# Patient Record
Sex: Male | Born: 1970 | Hispanic: Yes | Marital: Single | Smoking: Current some day smoker
Health system: Southern US, Community
[De-identification: ages and names within clinical notes are randomized; demographics above are authoritative.]

---
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
## 2019-03-15 ENCOUNTER — Other Ambulatory Visit: Payer: Self-pay

## 2019-03-15 ENCOUNTER — Emergency Department: Payer: Self-pay

## 2019-03-15 ENCOUNTER — Encounter: Payer: Self-pay | Admitting: Emergency Medicine

## 2019-03-15 ENCOUNTER — Inpatient Hospital Stay
Admission: EM | Admit: 2019-03-15 | Discharge: 2019-03-18 | DRG: 494 | Disposition: A | Discharge status: Home / Self Care | Payer: Self-pay | Attending: Orthopedic Surgery | Admitting: Orthopedic Surgery

## 2019-03-15 DIAGNOSIS — S82209A Unspecified fracture of shaft of unspecified tibia, initial encounter for closed fracture: Secondary | ICD-10-CM

## 2019-03-15 DIAGNOSIS — S82401A Unspecified fracture of shaft of right fibula, initial encounter for closed fracture: Secondary | ICD-10-CM | POA: Diagnosis present

## 2019-03-15 DIAGNOSIS — W1789XA Other fall from one level to another, initial encounter: Secondary | ICD-10-CM | POA: Diagnosis present

## 2019-03-15 DIAGNOSIS — Y9355 Activity, bike riding: Secondary | ICD-10-CM

## 2019-03-15 DIAGNOSIS — M7989 Other specified soft tissue disorders: Secondary | ICD-10-CM

## 2019-03-15 DIAGNOSIS — Z419 Encounter for procedure for purposes other than remedying health state, unspecified: Secondary | ICD-10-CM

## 2019-03-15 DIAGNOSIS — S82201A Unspecified fracture of shaft of right tibia, initial encounter for closed fracture: Secondary | ICD-10-CM

## 2019-03-15 DIAGNOSIS — S82241A Displaced spiral fracture of shaft of right tibia, initial encounter for closed fracture: Principal | ICD-10-CM

## 2019-03-15 LAB — CBC
HCT: 41.1 % (ref 39.0–52.0)
Hemoglobin: 14 g/dL (ref 13.0–17.0)
MCH: 30.3 pg (ref 26.0–34.0)
MCHC: 34.1 g/dL (ref 30.0–36.0)
MCV: 89 fL (ref 80.0–100.0)
Platelets: 254 10*3/uL (ref 150–400)
RBC: 4.62 MIL/uL (ref 4.22–5.81)
RDW: 12.3 % (ref 11.5–15.5)
WBC: 15.6 10*3/uL — ABNORMAL HIGH (ref 4.0–10.5)
nRBC: 0 % (ref 0.0–0.2)

## 2019-03-15 LAB — CBC WITH DIFFERENTIAL/PLATELET
Abs Immature Granulocytes: 0.03 10*3/uL (ref 0.00–0.07)
Basophils Absolute: 0 10*3/uL (ref 0.0–0.1)
Basophils Relative: 0 %
Eosinophils Absolute: 0.3 10*3/uL (ref 0.0–0.5)
Eosinophils Relative: 4 %
HCT: 40.4 % (ref 39.0–52.0)
Hemoglobin: 13.7 g/dL (ref 13.0–17.0)
Immature Granulocytes: 0 %
Lymphocytes Relative: 43 %
Lymphs Abs: 3.9 10*3/uL (ref 0.7–4.0)
MCH: 30.6 pg (ref 26.0–34.0)
MCHC: 33.9 g/dL (ref 30.0–36.0)
MCV: 90.2 fL (ref 80.0–100.0)
Monocytes Absolute: 0.7 10*3/uL (ref 0.1–1.0)
Monocytes Relative: 8 %
Neutro Abs: 4.1 10*3/uL (ref 1.7–7.7)
Neutrophils Relative %: 45 %
Platelets: 281 10*3/uL (ref 150–400)
RBC: 4.48 MIL/uL (ref 4.22–5.81)
RDW: 12.3 % (ref 11.5–15.5)
WBC: 9.1 10*3/uL (ref 4.0–10.5)
nRBC: 0 % (ref 0.0–0.2)

## 2019-03-15 LAB — BASIC METABOLIC PANEL
Anion gap: 11 (ref 5–15)
BUN: 14 mg/dL (ref 6–20)
CO2: 19 mmol/L — ABNORMAL LOW (ref 22–32)
Calcium: 7.7 mg/dL — ABNORMAL LOW (ref 8.9–10.3)
Chloride: 110 mmol/L (ref 98–111)
Creatinine, Ser: 1.38 mg/dL — ABNORMAL HIGH (ref 0.61–1.24)
GFR calc Af Amer: >60 mL/min (ref >60)
GFR calc non Af Amer: >60 mL/min (ref >60)
Glucose, Bld: 102 mg/dL — ABNORMAL HIGH (ref 70–99)
Potassium: 3.8 mmol/L (ref 3.5–5.1)
Sodium: 140 mmol/L (ref 135–145)

## 2019-03-15 LAB — APTT: aPTT: 29 seconds (ref 24–36)

## 2019-03-15 LAB — ETHANOL: Alcohol, Ethyl (B): 74 mg/dL — ABNORMAL HIGH (ref <10)

## 2019-03-15 LAB — COMPREHENSIVE METABOLIC PANEL
ALT: 15 U/L (ref 0–44)
AST: 19 U/L (ref 15–41)
Albumin: 3.7 g/dL (ref 3.5–5.0)
Alkaline Phosphatase: 40 U/L (ref 38–126)
Anion gap: 9 (ref 5–15)
BUN: 15 mg/dL (ref 6–20)
CO2: 22 mmol/L (ref 22–32)
Calcium: 8.2 mg/dL — ABNORMAL LOW (ref 8.9–10.3)
Chloride: 109 mmol/L (ref 98–111)
Creatinine, Ser: 1.29 mg/dL — ABNORMAL HIGH (ref 0.61–1.24)
GFR calc Af Amer: >60 mL/min (ref >60)
GFR calc non Af Amer: >60 mL/min (ref >60)
Glucose, Bld: 112 mg/dL — ABNORMAL HIGH (ref 70–99)
Potassium: 3.9 mmol/L (ref 3.5–5.1)
Sodium: 140 mmol/L (ref 135–145)
Total Bilirubin: 0.4 mg/dL (ref 0.3–1.2)
Total Protein: 6 g/dL — ABNORMAL LOW (ref 6.5–8.1)

## 2019-03-15 LAB — TYPE AND SCREEN
ABO/RH(D): O POS
Antibody Screen: NEGATIVE

## 2019-03-15 LAB — PROTIME-INR
INR: 1 (ref 0.8–1.2)
INR: 1 (ref 0.8–1.2)
Prothrombin Time: 12.8 seconds (ref 11.4–15.2)
Prothrombin Time: 13 seconds (ref 11.4–15.2)

## 2019-03-15 MED ORDER — HYDROMORPHONE HCL 1 MG/ML IJ SOLN
0.5000 mg | INTRAMUSCULAR | Status: DC | PRN
  Administered 2019-03-16 (×2): 0.5 mg via INTRAVENOUS
  Filled 2019-03-15 (×2): qty 1

## 2019-03-15 MED ORDER — HYDROMORPHONE HCL 1 MG/ML PO LIQD: 1.0000 mg | Freq: Once | ORAL | Status: DC

## 2019-03-15 MED ORDER — HYDROMORPHONE HCL 1 MG/ML IJ SOLN
INTRAMUSCULAR | Status: AC
  Administered 2019-03-15: 20:00:00 1 mg
  Filled 2019-03-15: qty 1

## 2019-03-15 MED ORDER — ONDANSETRON HCL 4 MG/2ML IJ SOLN
INTRAMUSCULAR | Status: AC
  Administered 2019-03-15: 4 mg via INTRAVENOUS
  Filled 2019-03-15: qty 2

## 2019-03-15 MED ORDER — CEFAZOLIN SODIUM-DEXTROSE 2-4 GM/100ML-% IV SOLN
2.0000 g | INTRAVENOUS | Status: AC
  Administered 2019-03-16: 2 g via INTRAVENOUS
  Filled 2019-03-15: qty 100

## 2019-03-15 MED ORDER — SODIUM CHLORIDE 0.9 % IV SOLN
INTRAVENOUS | Status: DC
  Administered 2019-03-15: 22:00:00 via INTRAVENOUS

## 2019-03-15 MED ORDER — ONDANSETRON HCL 4 MG/2ML IJ SOLN
4.0000 mg | Freq: Once | INTRAMUSCULAR | Status: AC
  Administered 2019-03-15: 4 mg via INTRAVENOUS

## 2019-03-15 MED ORDER — OXYCODONE HCL 5 MG PO TABS
5.0000 mg | ORAL_TABLET | ORAL | Status: DC | PRN
  Administered 2019-03-15 – 2019-03-16 (×3): 10 mg via ORAL
  Filled 2019-03-15 (×3): qty 2

## 2019-03-15 MED ORDER — HYDROMORPHONE HCL 1 MG/ML IJ SOLN
INTRAMUSCULAR | Status: AC
  Administered 2019-03-15: 1 mg
  Filled 2019-03-15: qty 1

## 2019-03-15 NOTE — ED Notes (Signed)
Pt co increasing pain to rle, EDP aware and meds given as ordered. Xray at bedside.

## 2019-03-15 NOTE — ED Notes (Signed)
Report called to Gavin Pound RN pt admitted to 122

## 2019-03-15 NOTE — ED Notes (Signed)
Posterior splint in place per EDT and EDP, circ and sensation normal after procedure.

## 2019-03-15 NOTE — ED Notes (Signed)
Pt awaiting admission, talking to family on phone to give them updates.
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
## 2019-03-15 NOTE — ED Notes (Signed)
Dr. Ernest Pine in to eval
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
## 2019-03-16 ENCOUNTER — Inpatient Hospital Stay: Payer: Self-pay

## 2019-03-16 ENCOUNTER — Inpatient Hospital Stay: Payer: Self-pay | Admitting: Anesthesiology

## 2019-03-16 ENCOUNTER — Encounter
Admission: EM | Disposition: A | Discharge status: Home / Self Care | Payer: Self-pay | Source: Home / Self Care | Attending: Orthopedic Surgery

## 2019-03-16 HISTORY — PX: TIBIA IM NAIL INSERTION: SHX2516

## 2019-03-16 LAB — URINALYSIS, ROUTINE W REFLEX MICROSCOPIC
Bilirubin Urine: NEGATIVE
Glucose, UA: NEGATIVE mg/dL
Hgb urine dipstick: NEGATIVE
Ketones, ur: NEGATIVE mg/dL
Leukocytes,Ua: NEGATIVE
Nitrite: NEGATIVE
Protein, ur: NEGATIVE mg/dL
Specific Gravity, Urine: 1.013 (ref 1.005–1.030)
pH: 6 (ref 5.0–8.0)

## 2019-03-16 LAB — SURGICAL PCR SCREEN
MRSA, PCR: NEGATIVE
Staphylococcus aureus: NEGATIVE

## 2019-03-16 SURGERY — INSERTION, INTRAMEDULLARY ROD, TIBIA
Anesthesia: General | Laterality: Right

## 2019-03-16 MED ORDER — ONDANSETRON HCL 4 MG PO TABS: 4.0000 mg | ORAL_TABLET | Freq: Four times a day (QID) | ORAL | Status: DC | PRN

## 2019-03-16 MED ORDER — PROPOFOL 500 MG/50ML IV EMUL
INTRAVENOUS | Status: AC
  Filled 2019-03-16: qty 50

## 2019-03-16 MED ORDER — PHENYLEPHRINE HCL (PRESSORS) 10 MG/ML IV SOLN
INTRAVENOUS | Status: DC | PRN
  Administered 2019-03-16 (×3): 100 ug via INTRAVENOUS

## 2019-03-16 MED ORDER — OXYCODONE HCL 5 MG PO TABS: 5.0000 mg | ORAL_TABLET | ORAL | Status: DC | PRN

## 2019-03-16 MED ORDER — LIDOCAINE HCL (CARDIAC) PF 100 MG/5ML IV SOSY
PREFILLED_SYRINGE | INTRAVENOUS | Status: DC | PRN
  Administered 2019-03-16: 100 mg via INTRAVENOUS

## 2019-03-16 MED ORDER — LIDOCAINE HCL (PF) 2 % IJ SOLN
INTRAMUSCULAR | Status: AC
  Filled 2019-03-16: qty 10

## 2019-03-16 MED ORDER — FENTANYL CITRATE (PF) 100 MCG/2ML IJ SOLN
INTRAMUSCULAR | Status: DC | PRN
  Administered 2019-03-16 (×4): 50 ug via INTRAVENOUS

## 2019-03-16 MED ORDER — ENOXAPARIN SODIUM 40 MG/0.4ML ~~LOC~~ SOLN
40.0000 mg | SUBCUTANEOUS | Status: DC
  Administered 2019-03-17 – 2019-03-18 (×2): 40 mg via SUBCUTANEOUS
  Filled 2019-03-16 (×2): qty 0.4

## 2019-03-16 MED ORDER — MENTHOL 3 MG MT LOZG
1.0000 | LOZENGE | OROMUCOSAL | Status: DC | PRN
  Filled 2019-03-16: qty 9

## 2019-03-16 MED ORDER — MIDAZOLAM HCL 2 MG/2ML IJ SOLN
INTRAMUSCULAR | Status: DC | PRN
  Administered 2019-03-16: 2 mg via INTRAVENOUS

## 2019-03-16 MED ORDER — METOCLOPRAMIDE HCL 5 MG PO TABS
10.0000 mg | ORAL_TABLET | Freq: Three times a day (TID) | ORAL | Status: DC
  Administered 2019-03-16 – 2019-03-18 (×7): 10 mg via ORAL
  Filled 2019-03-16 (×7): qty 2

## 2019-03-16 MED ORDER — METOCLOPRAMIDE HCL 5 MG PO TABS: 5.0000 mg | ORAL_TABLET | Freq: Three times a day (TID) | ORAL | Status: DC | PRN

## 2019-03-16 MED ORDER — MIDAZOLAM HCL 2 MG/2ML IJ SOLN
INTRAMUSCULAR | Status: AC
  Filled 2019-03-16: qty 2

## 2019-03-16 MED ORDER — CEFAZOLIN SODIUM-DEXTROSE 2-4 GM/100ML-% IV SOLN
2.0000 g | Freq: Four times a day (QID) | INTRAVENOUS | Status: AC
  Administered 2019-03-16 – 2019-03-17 (×4): 2 g via INTRAVENOUS
  Filled 2019-03-16 (×5): qty 100

## 2019-03-16 MED ORDER — ONDANSETRON HCL 4 MG/2ML IJ SOLN
INTRAMUSCULAR | Status: AC
  Filled 2019-03-16: qty 2

## 2019-03-16 MED ORDER — FENTANYL CITRATE (PF) 100 MCG/2ML IJ SOLN
INTRAMUSCULAR | Status: AC
  Filled 2019-03-16: qty 2

## 2019-03-16 MED ORDER — ROCURONIUM BROMIDE 100 MG/10ML IV SOLN
INTRAVENOUS | Status: DC | PRN
  Administered 2019-03-16 (×2): 25 mg via INTRAVENOUS

## 2019-03-16 MED ORDER — HYDROMORPHONE HCL 1 MG/ML IJ SOLN: 0.5000 mg | INTRAMUSCULAR | Status: DC | PRN

## 2019-03-16 MED ORDER — OXYCODONE HCL 5 MG PO TABS
10.0000 mg | ORAL_TABLET | ORAL | Status: DC | PRN
  Administered 2019-03-16 – 2019-03-18 (×9): 10 mg via ORAL
  Filled 2019-03-16 (×9): qty 2

## 2019-03-16 MED ORDER — METOCLOPRAMIDE HCL 5 MG/ML IJ SOLN: 5.0000 mg | Freq: Three times a day (TID) | INTRAMUSCULAR | Status: DC | PRN

## 2019-03-16 MED ORDER — SEVOFLURANE IN SOLN
RESPIRATORY_TRACT | Status: AC
  Filled 2019-03-16: qty 250

## 2019-03-16 MED ORDER — LACTATED RINGERS IV SOLN
INTRAVENOUS | Status: DC | PRN
  Administered 2019-03-16 (×2): via INTRAVENOUS

## 2019-03-16 MED ORDER — FENTANYL CITRATE (PF) 100 MCG/2ML IJ SOLN
INTRAMUSCULAR | Status: AC
  Administered 2019-03-16: 25 ug via INTRAVENOUS
  Filled 2019-03-16: qty 2

## 2019-03-16 MED ORDER — SENNOSIDES-DOCUSATE SODIUM 8.6-50 MG PO TABS
1.0000 | ORAL_TABLET | Freq: Two times a day (BID) | ORAL | Status: DC
  Administered 2019-03-16 – 2019-03-18 (×4): 1 via ORAL
  Filled 2019-03-16 (×4): qty 1

## 2019-03-16 MED ORDER — SUGAMMADEX SODIUM 200 MG/2ML IV SOLN
INTRAVENOUS | Status: DC | PRN
  Administered 2019-03-16: 200 mg via INTRAVENOUS

## 2019-03-16 MED ORDER — MAGNESIUM HYDROXIDE 400 MG/5ML PO SUSP
30.0000 mL | Freq: Every day | ORAL | Status: DC | PRN
  Administered 2019-03-18: 07:00:00 30 mL via ORAL
  Filled 2019-03-16 (×2): qty 30

## 2019-03-16 MED ORDER — ONDANSETRON HCL 4 MG/2ML IJ SOLN: 4.0000 mg | Freq: Four times a day (QID) | INTRAMUSCULAR | Status: DC | PRN

## 2019-03-16 MED ORDER — CEFAZOLIN SODIUM-DEXTROSE 2-4 GM/100ML-% IV SOLN
INTRAVENOUS | Status: AC
  Filled 2019-03-16: qty 100

## 2019-03-16 MED ORDER — TRAMADOL HCL 50 MG PO TABS
50.0000 mg | ORAL_TABLET | ORAL | Status: DC | PRN
  Administered 2019-03-17 – 2019-03-18 (×3): 100 mg via ORAL
  Filled 2019-03-16 (×3): qty 2

## 2019-03-16 MED ORDER — PANTOPRAZOLE SODIUM 40 MG PO TBEC
40.0000 mg | DELAYED_RELEASE_TABLET | Freq: Two times a day (BID) | ORAL | Status: DC
  Administered 2019-03-16 – 2019-03-18 (×4): 40 mg via ORAL
  Filled 2019-03-16 (×5): qty 1

## 2019-03-16 MED ORDER — OXYCODONE HCL 5 MG PO TABS: 5.0000 mg | ORAL_TABLET | Freq: Once | ORAL | Status: DC | PRN

## 2019-03-16 MED ORDER — BUPIVACAINE HCL (PF) 0.5 % IJ SOLN
INTRAMUSCULAR | Status: AC
  Filled 2019-03-16: qty 10

## 2019-03-16 MED ORDER — ACETAMINOPHEN 10 MG/ML IV SOLN
1000.0000 mg | Freq: Four times a day (QID) | INTRAVENOUS | Status: AC
  Administered 2019-03-16 – 2019-03-17 (×4): 1000 mg via INTRAVENOUS
  Filled 2019-03-16 (×4): qty 100

## 2019-03-16 MED ORDER — SUCCINYLCHOLINE CHLORIDE 20 MG/ML IJ SOLN
INTRAMUSCULAR | Status: DC | PRN
  Administered 2019-03-16: 100 mg via INTRAVENOUS

## 2019-03-16 MED ORDER — FLEET ENEMA 7-19 GM/118ML RE ENEM: 1.0000 | ENEMA | Freq: Once | RECTAL | Status: DC | PRN

## 2019-03-16 MED ORDER — ACETAMINOPHEN 10 MG/ML IV SOLN
INTRAVENOUS | Status: DC | PRN
  Administered 2019-03-16: 1000 mg via INTRAVENOUS

## 2019-03-16 MED ORDER — FENTANYL CITRATE (PF) 100 MCG/2ML IJ SOLN
25.0000 ug | INTRAMUSCULAR | Status: AC | PRN
  Administered 2019-03-16 (×6): 25 ug via INTRAVENOUS

## 2019-03-16 MED ORDER — ONDANSETRON HCL 4 MG/2ML IJ SOLN
INTRAMUSCULAR | Status: DC | PRN
  Administered 2019-03-16: 4 mg via INTRAVENOUS

## 2019-03-16 MED ORDER — PHENOL 1.4 % MT LIQD
1.0000 | OROMUCOSAL | Status: DC | PRN
  Filled 2019-03-16: qty 177

## 2019-03-16 MED ORDER — OXYCODONE HCL 5 MG/5ML PO SOLN: 5.0000 mg | Freq: Once | ORAL | Status: DC | PRN

## 2019-03-16 MED ORDER — DEXAMETHASONE SODIUM PHOSPHATE 10 MG/ML IJ SOLN
INTRAMUSCULAR | Status: AC
  Filled 2019-03-16: qty 1

## 2019-03-16 MED ORDER — BISACODYL 10 MG RE SUPP: 10.0000 mg | Freq: Every day | RECTAL | Status: DC | PRN

## 2019-03-16 MED ORDER — PROPOFOL 10 MG/ML IV BOLUS
INTRAVENOUS | Status: DC | PRN
  Administered 2019-03-16: 200 mg via INTRAVENOUS

## 2019-03-16 MED ORDER — ACETAMINOPHEN 10 MG/ML IV SOLN
INTRAVENOUS | Status: AC
  Filled 2019-03-16: qty 100

## 2019-03-16 MED ORDER — SUGAMMADEX SODIUM 200 MG/2ML IV SOLN
INTRAVENOUS | Status: AC
  Filled 2019-03-16: qty 2

## 2019-03-16 MED ORDER — SODIUM CHLORIDE 0.9 % IV SOLN
INTRAVENOUS | Status: DC
  Administered 2019-03-16 – 2019-03-17 (×2): via INTRAVENOUS

## 2019-03-16 MED ORDER — CELECOXIB 200 MG PO CAPS
200.0000 mg | ORAL_CAPSULE | Freq: Two times a day (BID) | ORAL | Status: DC
  Administered 2019-03-17 – 2019-03-18 (×4): 200 mg via ORAL
  Filled 2019-03-16 (×5): qty 1

## 2019-03-16 MED ORDER — ACETAMINOPHEN 325 MG PO TABS
325.0000 mg | ORAL_TABLET | Freq: Four times a day (QID) | ORAL | Status: DC | PRN
  Administered 2019-03-18 (×2): 650 mg via ORAL
  Filled 2019-03-16 (×2): qty 2

## 2019-03-16 MED ORDER — ROCURONIUM BROMIDE 50 MG/5ML IV SOLN
INTRAVENOUS | Status: AC
  Filled 2019-03-16: qty 1

## 2019-03-16 SURGICAL SUPPLY — 45 items
BIT DRILL CAL 3.2 LONG (BIT) ×2 IMPLANT
BIT DRILL CAL 3.2MM LONG (BIT) ×1
BIT DRILL SHORT 3.2MM (DRILL) ×1 IMPLANT
CANISTER SUCT 1200ML W/VALVE (MISCELLANEOUS) ×3 IMPLANT
COVER WAND RF STERILE (DRAPES) ×3 IMPLANT
CUFF TOURN SGL QUICK 24 (TOURNIQUET CUFF) ×2
CUFF TRNQT CYL 24X4X16.5-23 (TOURNIQUET CUFF) ×1 IMPLANT
DRAPE C-ARM XRAY 36X54 (DRAPES) ×3 IMPLANT
DRAPE C-ARMOR (DRAPES) ×3 IMPLANT
DRAPE U-SHAPE 47X51 STRL (DRAPES) ×3 IMPLANT
DRILL SHORT 3.2MM (DRILL) ×3
DRSG DERMACEA 8X12 NADH (GAUZE/BANDAGES/DRESSINGS) ×3 IMPLANT
DRSG OPSITE POSTOP 3X4 (GAUZE/BANDAGES/DRESSINGS) ×9 IMPLANT
DRSG OPSITE POSTOP 4X8 (GAUZE/BANDAGES/DRESSINGS) ×3 IMPLANT
DURAPREP 26ML APPLICATOR (WOUND CARE) ×6 IMPLANT
ELECT REM PT RETURN 9FT ADLT (ELECTROSURGICAL) ×3
ELECTRODE REM PT RTRN 9FT ADLT (ELECTROSURGICAL) ×1 IMPLANT
GLOVE BIOGEL M STRL SZ7.5 (GLOVE) ×3 IMPLANT
GLOVE INDICATOR 8.0 STRL GRN (GLOVE) ×3 IMPLANT
GOWN STRL REUS W/ TWL LRG LVL3 (GOWN DISPOSABLE) ×2 IMPLANT
GOWN STRL REUS W/TWL LRG LVL3 (GOWN DISPOSABLE) ×4
GUIDEWIRE 3.2X400 (WIRE) ×3 IMPLANT
HOLDER FOLEY CATH W/STRAP (MISCELLANEOUS) ×3 IMPLANT
KIT TURNOVER KIT A (KITS) ×3 IMPLANT
NAIL TIBIAL EX 9X330MM (Nail) ×3 IMPLANT
NEEDLE FILTER BLUNT 18X 1/2SAF (NEEDLE) ×2
NEEDLE FILTER BLUNT 18X1 1/2 (NEEDLE) ×1 IMPLANT
NS IRRIG 1000ML POUR BTL (IV SOLUTION) ×3 IMPLANT
PACK TOTAL KNEE (MISCELLANEOUS) ×3 IMPLANT
REAMER ROD DEEP FLUTE 2.5X950 (INSTRUMENTS) ×3 IMPLANT
SCREW LOCK 4X36 TI (Screw) ×3 IMPLANT
SCREW LOCK T25 FT 32X4X3.3X (Screw) ×2 IMPLANT
SCREW LOCKING 4.0 34MM (Screw) ×3 IMPLANT
SCREW LOCKING 4.0 40MM (Screw) ×3 IMPLANT
SCREW LOCKING 4X32 (Screw) ×4 IMPLANT
SCREW LOCKING 4X38 (Screw) ×3 IMPLANT
SOL PREP PVP 2OZ (MISCELLANEOUS) ×3
SOLUTION PREP PVP 2OZ (MISCELLANEOUS) ×1 IMPLANT
STAPLER SKIN PROX 35W (STAPLE) ×3 IMPLANT
SUT VIC AB 0 CT1 36 (SUTURE) ×3 IMPLANT
SUT VIC AB 1 CT1 36 (SUTURE) ×3 IMPLANT
SUT VIC AB 2-0 CT1 27 (SUTURE) ×2
SUT VIC AB 2-0 CT1 TAPERPNT 27 (SUTURE) ×1 IMPLANT
SYR 10ML LL (SYRINGE) IMPLANT
TRAY FOLEY SLVR 16FR LF STAT (SET/KITS/TRAYS/PACK) ×6 IMPLANT

## 2019-03-16 NOTE — Transfer of Care (Signed)
Immediate Anesthesia Transfer of Care Note  Patient: Grant Zamora  Procedure(s) Performed: Procedure(s): INTRAMEDULLARY (IM) NAIL TIBIAL (Right)  Patient Location: PACU  Anesthesia Type:General  Level of Consciousness: sedated  Airway & Oxygen Therapy: Patient Spontanous Breathing and Patient connected to face mask oxygen  Post-op Assessment: Report given to RN and Post -op Vital signs reviewed and stable  Post vital signs: Reviewed and stable  Last Vitals:  Vitals:   03/16/19 1519 03/16/19 1520  BP:  131/89  Pulse:    Resp:  20  Temp: (P) 36.6 C   SpO2:  99%    Complications: No apparent anesthesia complications

## 2019-03-16 NOTE — Progress Notes (Signed)
Pt off floor to OR

## 2019-03-16 NOTE — Anesthesia Procedure Notes (Signed)
Procedure Name: Intubation Performed by: Stormy Fabian, CRNA Pre-anesthesia Checklist: Patient identified, Patient being monitored, Timeout performed, Emergency Drugs available and Suction available Patient Re-evaluated:Patient Re-evaluated prior to induction Oxygen Delivery Method: Circle system utilized Preoxygenation: Pre-oxygenation with 100% oxygen Induction Type: IV induction Ventilation: Mask ventilation without difficulty Laryngoscope Size: McGraph and 4 Grade View: Grade I Tube type: Oral Tube size: 7.5 mm Number of attempts: 1 Airway Equipment and Method: Stylet and Video-laryngoscopy Placement Confirmation: ETT inserted through vocal cords under direct vision,  positive ETCO2 and breath sounds checked- equal and bilateral Secured at: 21 cm Tube secured with: Tape Dental Injury: Teeth and Oropharynx as per pre-operative assessment

## 2019-03-16 NOTE — Anesthesia Preprocedure Evaluation (Addendum)
Anesthesia Evaluation  Patient identified by MRN, date of birth, ID band Patient awake    Reviewed: Allergy & Precautions, H&P , NPO status , Patient's Chart, lab work & pertinent test results, reviewed documented beta blocker date and time   History of Anesthesia Complications Negative for: history of anesthetic complications  Airway Mallampati: III  TM Distance: >3 FB Neck ROM: full    Dental  (+) Chipped   Pulmonary neg pulmonary ROS, neg shortness of breath, Current Smoker,    Pulmonary exam normal        Cardiovascular Exercise Tolerance: Good (-) angina(-) Past MI and (-) DOE negative cardio ROS Normal cardiovascular exam Rhythm:regular Rate:Normal     Neuro/Psych negative neurological ROS  negative psych ROS   GI/Hepatic negative GI ROS, Neg liver ROS, neg GERD  ,  Endo/Other  negative endocrine ROS  Renal/GU negative Renal ROS  negative genitourinary   Musculoskeletal   Abdominal   Peds  Hematology negative hematology ROS (+)   Anesthesia Other Findings History reviewed. No pertinent past medical history.  Past Surgical History: 1990: CRANIOTOMY     Comment:  ?  Evacuation of subdural hematoma  BMI    Body Mass Index:  25.10 kg/m      Reproductive/Obstetrics negative OB ROS                            Anesthesia Physical Anesthesia Plan  ASA: II and emergent  Anesthesia Plan: General ETT   Post-op Pain Management:    Induction: Intravenous  PONV Risk Score and Plan: 2 and Dexamethasone, Ondansetron, Midazolam and Treatment may vary due to age or medical condition  Airway Management Planned: Oral ETT  Additional Equipment:   Intra-op Plan:   Post-operative Plan: Extubation in OR  Informed Consent: I have reviewed the patients History and Physical, chart, labs and discussed the procedure including the risks, benefits and alternatives for the proposed anesthesia  with the patient or authorized representative who has indicated his/her understanding and acceptance.     Dental Advisory Given  Plan Discussed with: Anesthesiologist, CRNA and Surgeon  Anesthesia Plan Comments: (Patient consented for risks of anesthesia including but not limited to:  - adverse reactions to medications - damage to teeth, lips or other oral mucosa - sore throat or hoarseness - Damage to heart, brain, lungs or loss of life  Patient voiced understanding.)      Anesthesia Quick Evaluation

## 2019-03-16 NOTE — Anesthesia Post-op Follow-up Note (Signed)
Anesthesia QCDR form completed.        
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
## 2019-03-17 ENCOUNTER — Encounter: Payer: Self-pay | Admitting: Orthopedic Surgery

## 2019-03-17 LAB — HIV ANTIBODY (ROUTINE TESTING W REFLEX): HIV Screen 4th Generation wRfx: NONREACTIVE

## 2019-03-17 NOTE — TOC Initial Note (Signed)
Transition of Care (TOC) - Initial/Assessment Note    Patient Details  Name: Grant Zamora MRN: [id number] Date of Birth: 08/06/1971  Transition of Care (TOC) CM/SW Contact:    Deliliah J Gregory, RN Phone Number: 03/17/2019, 2:11 PM  Clinical Narrative:                 Met with the patient to discuss DC needs and plan He lives at home with minor children and spouse, he drives and gets around independently, since he is partial weight bearing will need a RW, notified Brad with [employer] Needs assistance with Lovenox at DC, gave [facility] card, Med Mgt can't help with Lovenox He has no other needs at this time  Expected Discharge Plan: Home/Self Care Barriers to Discharge: Continued Medical Work up   Patient Goals and CMS Choice Patient states their goals for this hospitalization and ongoing recovery are:: go home      Expected Discharge Plan and Services Expected Discharge Plan: Home/Self Care   Discharge Planning Services: CM Consult, Medication Assistance(Gave [facility] card, Medication management does not help with lovenox)   Living arrangements for the past 2 months: Single Family Home                 DME Arranged: Walker rolling DME Agency: [employer] Date DME Agency Contacted: 03/17/19 Time DME Agency Contacted: [id number] Representative spoke with at DME Agency: Brad HH Arranged: (declines)          Prior Living Arrangements/Services Living arrangements for the past 2 months: Single Family Home Lives with:: Relatives, Minor Children, Spouse Patient language and need for interpreter reviewed:: No Do you feel safe going back to the place where you live?: Yes      Need for Family Participation in Patient Care: No (Comment) Care giver support system in place?: Yes (comment)   Criminal Activity/Legal Involvement Pertinent to Current Situation/Hospitalization: No - Comment as needed  Activities of Daily Living Home Assistive Devices/Equipment: None ADL Screening (condition  at time of admission) Patient's cognitive ability adequate to safely complete daily activities?: Yes Is the patient deaf or have difficulty hearing?: No Does the patient have difficulty seeing, even when wearing glasses/contacts?: No Does the patient have difficulty concentrating, remembering, or making decisions?: No Patient able to express need for assistance with ADLs?: Yes Does the patient have difficulty dressing or bathing?: No Independently performs ADLs?: Yes (appropriate for developmental age) Does the patient have difficulty walking or climbing stairs?: No Weakness of Legs: None Weakness of Arms/Hands: None  Permission Sought/Granted                  Emotional Assessment Appearance:: Appears stated age Attitude/Demeanor/Rapport: Engaged Affect (typically observed): Accepting Orientation: : Oriented to Self, Oriented to Place, Oriented to  Time, Oriented to Situation Alcohol / Substance Use: Tobacco Use Psych Involvement: No (comment)  Admission diagnosis:  Closed fracture of right tibia and fibula, initial encounter [S82.201A, S82.401A] Patient Active Problem List   Diagnosis Date Noted  . Displaced spiral fracture of shaft of right tibia, initial encounter for closed fracture 03/15/2019   PCP:  Patient, No Pcp Per Pharmacy:   [id number] - [place], [place] - [address] [place] [address] Phone: [phone] Fax: [phone]     Social Determinants of Health (SDOH) Interventions    Readmission Risk Interventions No flowsheet data found.  

## 2019-03-17 NOTE — Progress Notes (Signed)
ORTHOPAEDICS PROGRESS NOTE  PATIENT NAME: Grant Zamora DOB: July 06, 1971  MRN: [id number]  POD # 1: IM nailing of right tibial shaft fracture   Subjective: The patient rested well last night.  Pain has been under good control.  He denies any nausea or vomiting.  Objective: Vital signs in last 24 hours: Temp:  [97.6 F (36.4 C)-98.8 F (37.1 C)] 98 F (36.7 C) (04/30 0426) Pulse Rate:  [72-116] 72 (04/30 0426) Resp:  [11-24] 18 (04/30 0426) BP: (101-131)/(71-89) 104/72 (04/30 0426) SpO2:  [91 %-100 %] 97 % (04/30 0426) Weight:  [77.1 kg] 77.1 kg (04/29 1030)  Intake/Output from previous day: 04/29 0701 - 04/30 0700 In: 1584.5 [P.O.:240; I.V.:1274.6; IV Piggyback:69.8] Out: 4350 [Urine:4300; Blood:50]  Recent Labs    03/15/19 2014 03/15/19 2227  WBC 9.1 15.6*  HGB 13.7 14.0  HCT 40.4 41.1  PLT 281 254  K 3.8 3.9  CL 110 109  CO2 19* 22  BUN 14 15  CREATININE 1.38* 1.29*  GLUCOSE 102* 112*  CALCIUM 7.7* 8.2*  INR 1.0 1.0    EXAM General: Well-developed well-nourished male seen in no apparent discomfort. Lungs: clear to auscultation Cardiac: normal rate and regular rhythm Right lower extremity: Dressing is dry and intact.  Gentle range of motion of the knee and ankle is well-tolerated.  Pedal pulses are palpable.  Good capillary refill. Neurologic: Awake, alert, and oriented.  Sensory and motor function are intact.  Assessment: IM nailing of a right tibial shaft fracture  Plan: Begin physical therapy and Occupational Therapy.  The patient is to be only partial weightbearing to the right lower extremity. Plan is to go Home after hospital stay. DVT Prophylaxis - Lovenox, Foot Pumps and TED hose  Rei Contee P. Angie Fava M.D.
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
## 2019-03-18 ENCOUNTER — Inpatient Hospital Stay: Payer: Self-pay

## 2019-03-18 MED ORDER — ENOXAPARIN SODIUM 40 MG/0.4ML ~~LOC~~ SOLN: 40.0000 mg | SUBCUTANEOUS | 0 refills | Status: DC

## 2019-03-18 MED ORDER — OXYCODONE HCL 5 MG PO TABS: 5.0000 mg | ORAL_TABLET | ORAL | 0 refills | Status: DC | PRN
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
## 2019-03-18 NOTE — Progress Notes (Signed)
PT Cancellation Note  Patient Details Name: BASCOM STAMLER MRN: [id number] DOB: [birth year]   Cancelled Treatment:    Reason Eval/Treat Not Completed: Medical issues which prohibited therapy;Patient not medically ready Patient gone to ultrasound d/t LE swelling. Will check back as able  Staci Acosta PT, DPT Staci Acosta 03/18/2019, 9:15 AM
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
## 2019-03-18 NOTE — Progress Notes (Signed)
  Subjective: 2 Days Post-Op Procedure(s) (LRB): INTRAMEDULLARY (IM) NAIL TIBIAL (Right) Patient reports pain as mild.   Patient is well, and has had no acute complaints or problems Plan is to go Home after hospital stay. Negative for chest pain and shortness of breath Fever: no Gastrointestinal:Negative for nausea and vomiting  Objective: Vital signs in last 24 hours: Temp:  [97.7 F (36.5 C)-98.7 F (37.1 C)] 98.3 F (36.8 C) (05/01 0751) Pulse Rate:  [70-86] 71 (05/01 0751) Resp:  [17-19] 17 (05/01 0751) BP: (101-112)/(61-82) 101/74 (05/01 0751) SpO2:  [96 %-100 %] 98 % (05/01 0751)  Intake/Output from previous day:  Intake/Output Summary (Last 24 hours) at 03/18/2019 0821 Last data filed at 03/18/2019 0407 Gross per 24 hour  Intake 600 ml  Output 1798 ml  Net -1198 ml    Intake/Output this shift: No intake/output data recorded.  Labs: Recent Labs    03/15/19 2014 03/15/19 2227  HGB 13.7 14.0   Recent Labs    03/15/19 2014 03/15/19 2227  WBC 9.1 15.6*  RBC 4.48 4.62  HCT 40.4 41.1  PLT 281 254   Recent Labs    03/15/19 2014 03/15/19 2227  NA 140 140  K 3.8 3.9  CL 110 109  CO2 19* 22  BUN 14 15  CREATININE 1.38* 1.29*  GLUCOSE 102* 112*  CALCIUM 7.7* 8.2*   Recent Labs    03/15/19 2014 03/15/19 2227  INR 1.0 1.0     EXAM General - Patient is Alert, Appropriate and Oriented Extremity - Neurovascular intact Sensation intact distally Intact pulses distally Dorsiflexion/Plantar flexion intact Dressing/Incision - Bulky dressing removed, honeycomb dressing at the ankle changed this AM.  New ACE wrap applied. Motor Function - intact, moving foot and toes well on exam.  Positive Homan's to the right leg this morning.  History reviewed. No pertinent past medical history.  Assessment/Plan: 2 Days Post-Op Procedure(s) (LRB): INTRAMEDULLARY (IM) NAIL TIBIAL (Right) Active Problems:   Displaced spiral fracture of shaft of right tibia, initial  encounter for closed fracture  Estimated body mass index is 25.1 kg/m as calculated from the following:   Height as of this encounter: 5\' 9"  (1.753 m).   Weight as of this encounter: 77.1 kg. Advance diet Up with therapy   Patient performed well with PT yesterday. No signs of infection to the right leg. Questionable Homan's test this morning, will obtain US prior to discharge. If [place] negative will plan for discharge home today.  DVT Prophylaxis - Lovenox and Foot Pumps TTWB to the right leg.  Valeria Batman, PA-C [facility] 03/18/2019, 8:21 AM

## 2019-03-18 NOTE — TOC Transition Note (Signed)
Transition of Care [facility]) - CM/SW Discharge Note   Patient Details  Name: Grant Zamora MRN: [id number] Date of Birth: November 07, 1971  [facility] CM/SW Contact:  Virgel Manifold, RN Phone Number: 03/18/2019, 1:24 PM   Clinical Narrative:  Patient to discharge with outpatient PT/OT. Patient is uninsured but apparently has been working with [facility] ortho office on obtaining outpatient therapy schedule. I have talked with ortho PA about this and the patient was given a number to schedule his appointments with. Charity rolling walker and crutches were given to patient.     Final next level of care: Home/Self Care Barriers to Discharge: Continued Medical Work up   Patient Goals and CMS Choice Patient states their goals for this hospitalization and ongoing recovery are:: go home      Discharge Placement                       Discharge Plan and Services   Discharge Planning Services: CM Consult, Medication Assistance(Gave Good RX card, Medication management does not help with lovenox)            DME Arranged: Walker rolling DME Agency: [employer] Date DME Agency Contacted: 03/17/19 Time DME Agency Contacted: [id number] Representative spoke with at DME Agency: [facility] HH Arranged: (declines)          Social Determinants of Health (SDOH) Interventions     Readmission Risk Interventions No flowsheet data found.

## 2019-03-18 NOTE — Progress Notes (Signed)
OT Cancellation Note  Patient Details Name: Grant Zamora MRN: [id number] DOB: Sep 07, 1971   Cancelled Treatment:    Reason Eval/Treat Not Completed: (Ultrasound to r/o DVT secondary to right LE edema, pain, and swelling. WIll continue to monitor, and intervene at a later/date time. )  Olegario Messier, MS, OTR/L 03/18/2019, 11:08 AM

## 2019-03-18 NOTE — Discharge Instructions (Signed)
Take aspirin 325mg  daily for DVT Preventon  Intramedullary Nailing of Tibial Diaphyseal Fracture, Care After This sheet gives you information about how to care for yourself after your procedure. Your health care provider may also give you more specific instructions. If you have problems or questions, contact your health care provider. What can I expect after the procedure? After the procedure, it is common to have:  Pain and swelling in your lower leg. Follow these instructions at home: Medicines  Take over-the-counter and prescription medicines only as told by your health care provider.  If you were prescribed an antibiotic medicine, take it as told by your health care provider. Do not stop taking the antibiotic even if you start to feel better.  Do not drive or use heavy machinery while taking prescription medicine or until your health care provider approves. If you have a splint:  Wear the splint as told by your health care provider. Remove it only as told by your health care provider.  Loosen the splint if your toes tingle, become numb, or turn cold and blue.  Keep the splint clean.  If the splint is not waterproof: ? Do not let it get wet. ? Cover it with a watertight covering when you take a shower. Bathing  Do not take baths, swim, or use a hot tub until your health care provider approves. You may only be allowed to take sponge baths for bathing for the first few days.  Ask your health care provider if you can take showers.  Keep your bandage (dressing) dry until your health care provider says it can be removed. Incision care   Follow instructions from your health care provider about how to take care of your incision. Make sure you: ? Wash your hands with soap and water before you change your bandage (dressing). If soap and water are not available, use hand sanitizer. ? Change your dressing as told by your health care provider. ? Leave stitches (sutures), skin glue, or  adhesive strips in place. These skin closures may need to stay in place for 2 weeks or longer. If adhesive strip edges start to loosen and curl up, you may trim the loose edges. Do not remove adhesive strips completely unless your health care provider tells you to do that.  Check your incision area every day for signs of infection. Check for: ? More redness, swelling, or pain. ? More fluid or blood. ? Warmth. ? Pus or a bad smell. Managing pain, stiffness, and swelling    Raise (elevate) the injured area above the level of your heart while you are sitting or lying down.  Move your toes often to avoid stiffness and to lessen swelling.  If directed, put ice on the injured area. ? Put ice in a plastic bag. ? Place a towel between your skin and the bag. ? Leave the ice on for 20 minutes, 2-3 times a day. Activity  Do not use the injured limb to support your body weight. You may be able to put some weight on your leg (partial weight-bearing). Use crutches or a walker. Follow directions as told by your healthcare provider.  Avoid sitting or lying for a long time without moving. You will be encouraged to walk with assistance as soon as you can. This will help prevent blood clots.  If physical therapy was prescribed, do exercises as directed. It is important to do physical therapy to regain muscle strength and range of motion in your leg.  Rest and  avoid activities that require a lot of effort or put you at risk for a fall or another leg injury. Ask your health care provider what activities are safe for you and when you can begin leg motion after surgery. General instructions  To prevent or treat constipation while you are taking prescription pain medicine, your health care provider may recommend that you: ? Drink enough fluid to keep your urine clear or pale yellow. ? Take over-the-counter or prescription medicines. ? Eat foods that are high in fiber, such as fresh fruits and vegetables,  whole grains, and beans. ? Limit foods that are high in fat and processed sugars, such as fried and sweet foods.  Do not use any products that contain nicotine or tobacco, such as cigarettes and e-cigarettes. These can delay bone healing. If you need help quitting, ask your health care provider.  Take steps to prevent falls at home, such as removing throw rugs and tripping hazards.  Keep all follow-up visits as told by your health care provider. This is important. Contact a health care provider if:  You have more redness, swelling, or pain around your incision.  You have more fluid or blood coming from your incision.  Your incision feels warm to the touch.  You have pus or a bad smell coming from your incision.  You have a fever. Get help right away if:  Your incision breaks open.  You have red, painful, or swollen areas in one or both legs.  You have severe pain that does not get better with medicine. Summary  After your procedure, it is common to have pain and swelling in your lower leg.  Check your incision area every day for signs of infection, such as more redness or swelling.  Do not use the injured limb to support your body weight. You may be able to put some weight on your leg (partial weight-bearing). Use crutches or a walker. Follow directions as told by your healthcare provider.  Ask your health care provider what activities are safe for you while you recover. It is important to do physical therapy as instructed to regain strength and range of motion in your leg.  Keep all follow-up visits as told by your health care provider. This is important This information is not intended to replace advice given to you by your health care provider. Make sure you discuss any questions you have with your health care provider. Document Released: 11/08/2013 Document Revised: 01/07/2017 Document Reviewed: 01/07/2017 Elsevier Interactive Patient Education  2019 [employer].

## 2019-03-18 NOTE — Progress Notes (Signed)
Discharge instructions reviewed with patient. IV removed. I spoke with PA [facility] via text page and he said that prescriptions sent in via electronically. Paper prescription for oxycodone was no good so he told RN to discard and he would correct it. Patient getting dressed now and family will be here to pick him up shortly  Roxy Cedar, RN

## 2019-03-23 NOTE — Anesthesia Postprocedure Evaluation (Signed)
Anesthesia Post Note  Patient: Grant Zamora  Procedure(s) Performed: INTRAMEDULLARY (IM) NAIL TIBIAL (Right )  Patient location during evaluation: PACU Anesthesia Type: General Level of consciousness: awake and alert and oriented Pain management: pain level controlled Vital Signs Assessment: post-procedure vital signs reviewed and stable Respiratory status: spontaneous breathing Cardiovascular status: blood pressure returned to baseline Anesthetic complications: no     Last Vitals:  Vitals:   03/18/19 0751 03/18/19 1437  BP: 101/74 119/90  Pulse: 71 90  Resp: 17 17  Temp: 36.8 C 36.4 C  SpO2: 98% 99%    Last Pain:  Vitals:   03/18/19 1437  TempSrc: Oral  PainSc:                  Melisse Caetano

## 2019-04-26 ENCOUNTER — Other Ambulatory Visit: Payer: Self-pay

## 2019-04-26 ENCOUNTER — Encounter: Payer: Self-pay | Admitting: Medical Oncology

## 2019-04-26 ENCOUNTER — Emergency Department: Payer: Self-pay

## 2019-04-26 ENCOUNTER — Emergency Department
Admission: EM | Admit: 2019-04-26 | Discharge: 2019-04-26 | Disposition: A | Discharge status: Home / Self Care | Payer: Self-pay | Attending: Emergency Medicine | Admitting: Emergency Medicine

## 2019-04-26 DIAGNOSIS — Y9389 Activity, other specified: Secondary | ICD-10-CM | POA: Insufficient documentation

## 2019-04-26 DIAGNOSIS — Y998 Other external cause status: Secondary | ICD-10-CM | POA: Insufficient documentation

## 2019-04-26 DIAGNOSIS — S0101XA Laceration without foreign body of scalp, initial encounter: Secondary | ICD-10-CM | POA: Insufficient documentation

## 2019-04-26 DIAGNOSIS — W228XXA Striking against or struck by other objects, initial encounter: Secondary | ICD-10-CM | POA: Insufficient documentation

## 2019-04-26 DIAGNOSIS — Y9289 Other specified places as the place of occurrence of the external cause: Secondary | ICD-10-CM | POA: Insufficient documentation

## 2019-04-26 DIAGNOSIS — F17210 Nicotine dependence, cigarettes, uncomplicated: Secondary | ICD-10-CM | POA: Insufficient documentation

## 2019-04-26 MED ORDER — SULFAMETHOXAZOLE-TRIMETHOPRIM 800-160 MG PO TABS: 1.0000 | ORAL_TABLET | Freq: Two times a day (BID) | ORAL | 0 refills | Status: AC

## 2019-04-26 MED ORDER — CEPHALEXIN 500 MG PO CAPS: 500.0000 mg | ORAL_CAPSULE | Freq: Three times a day (TID) | ORAL | 0 refills | Status: AC
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
## 2019-04-26 NOTE — Discharge Instructions (Signed)
Keep wound uncovered. Take Keflex 3 times daily for the next week. Take Bactrim 2 times daily for the next week.

## 2020-04-22 IMAGING — US RIGHT LOWER EXTREMITY VENOUS ULTRASOUND
1 series · 13 of 24 positions shown · non-contrast
Comparison: None.

CLINICAL DATA: Right lower extremity pain and edema. History of
recent ORIF tibia fracture and reduction fibular fracture on
03/16/2019.



[Series 1: right lower extremity venous ultrasound · 0.08mm/px · 13 of 34 slices shown]
[im 1/34]
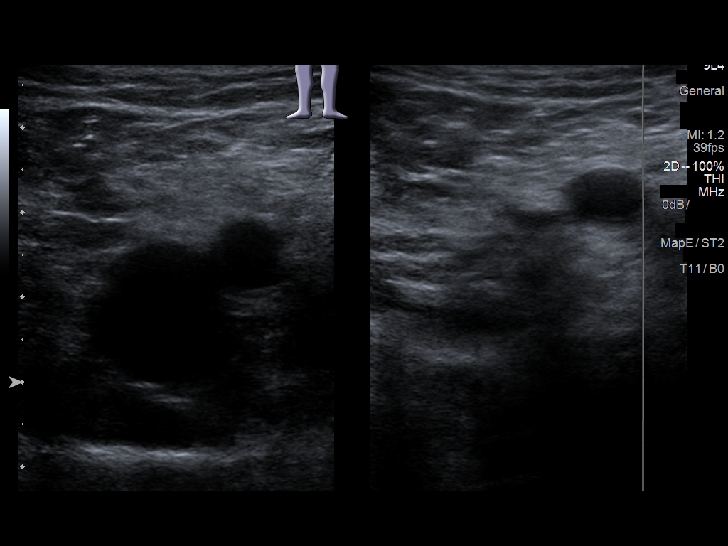
[im 3/34]
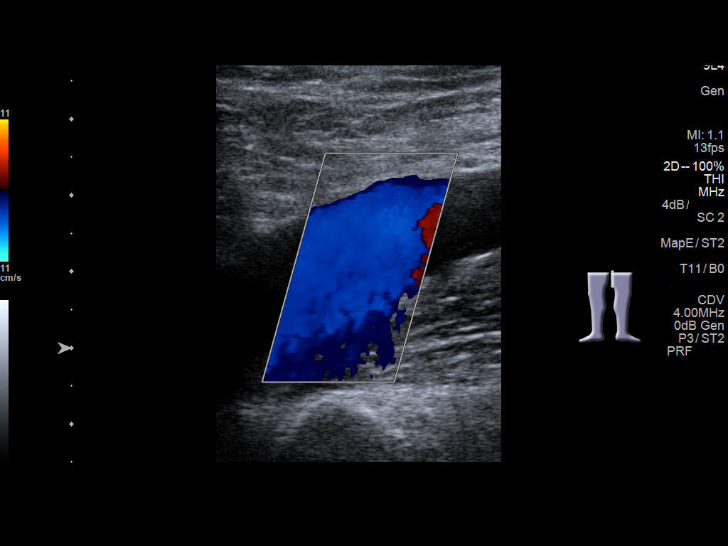
[im 6/34]
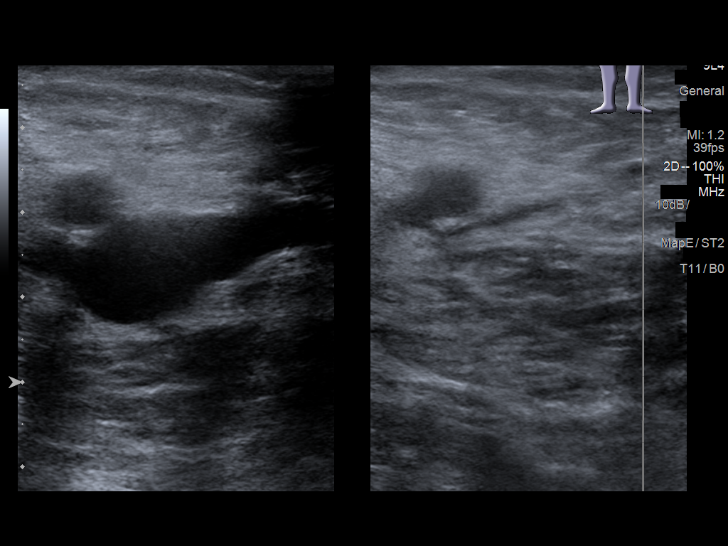
[im 9/34]
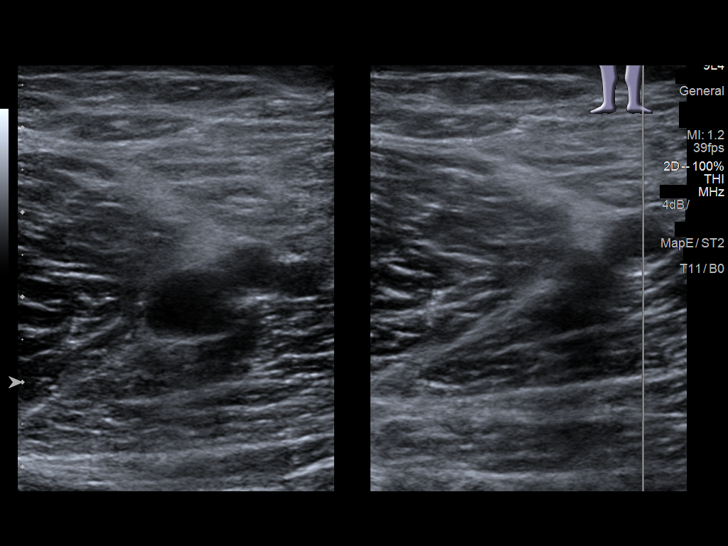
[im 12/34]
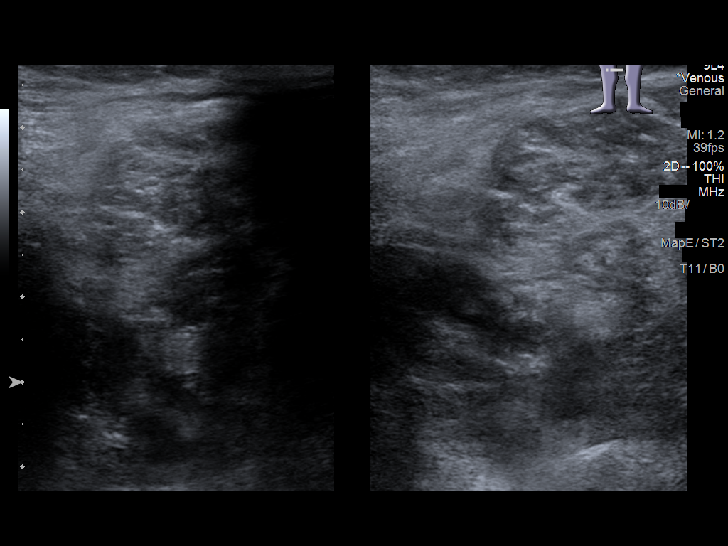
[im 15/34]
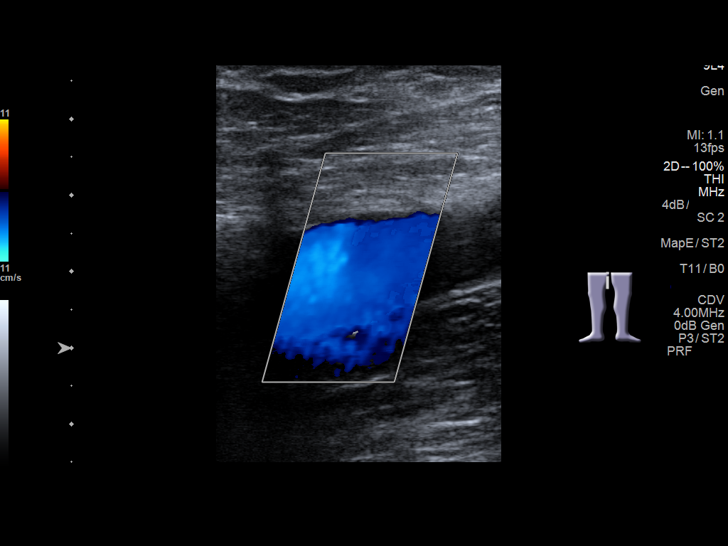
[im 18/34]
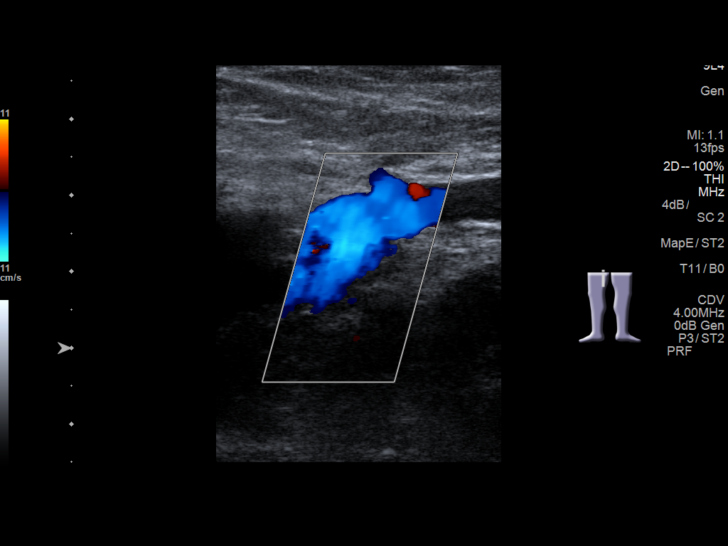
[im 19/34]
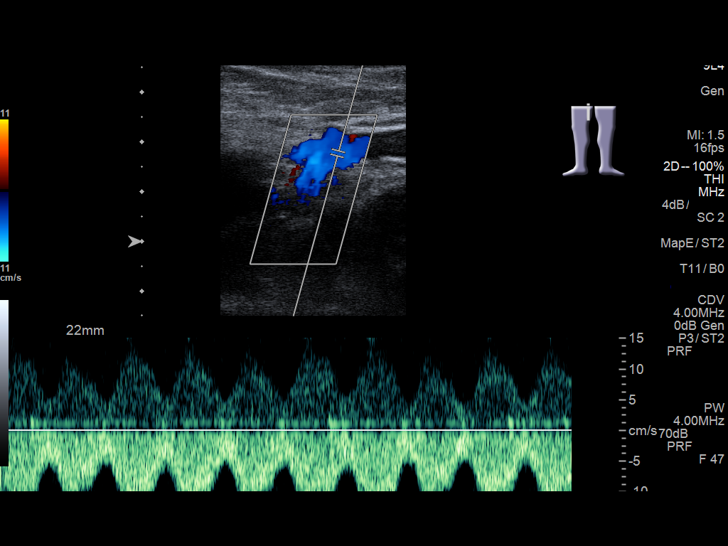
[im 22/34]
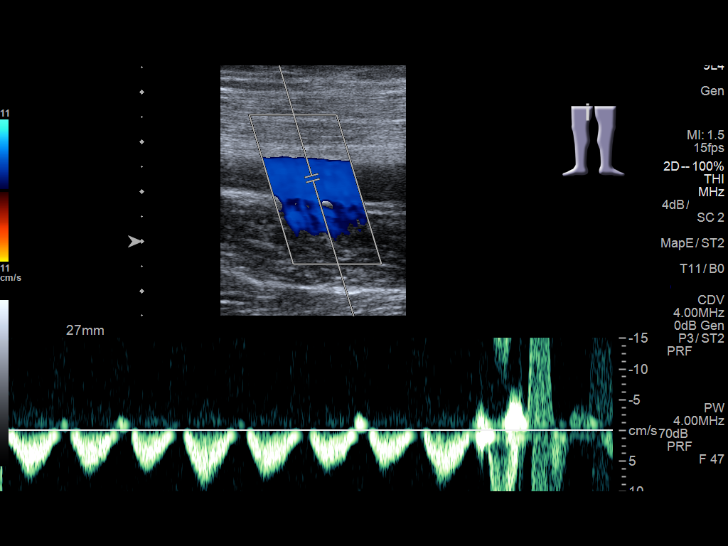
[im 25/34]
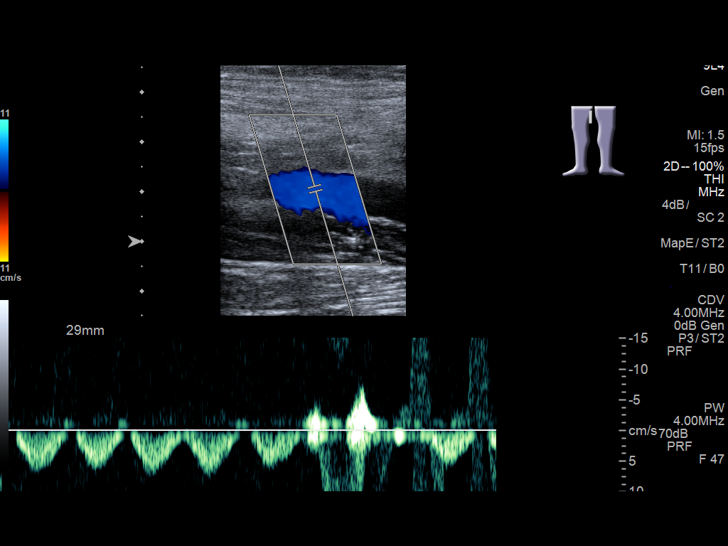
[im 28/34]
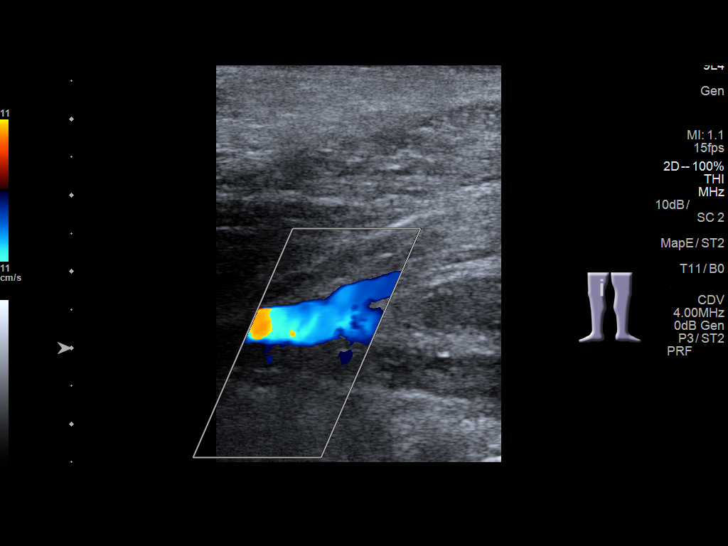
[im 31/34]
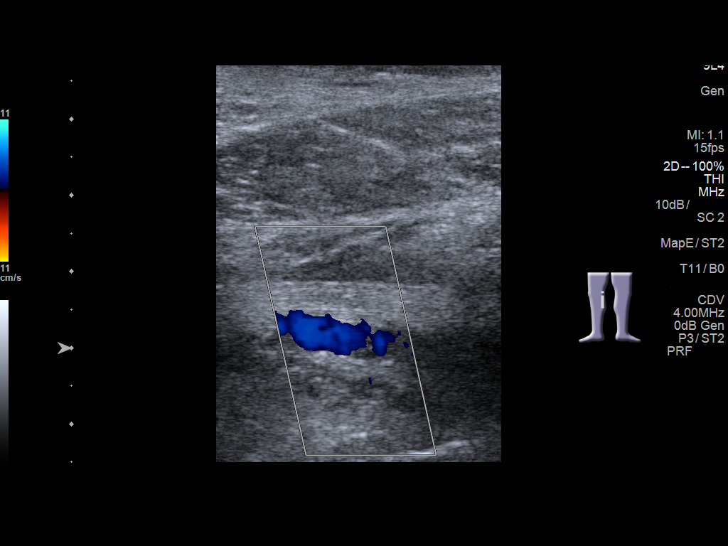
[im 34/34]
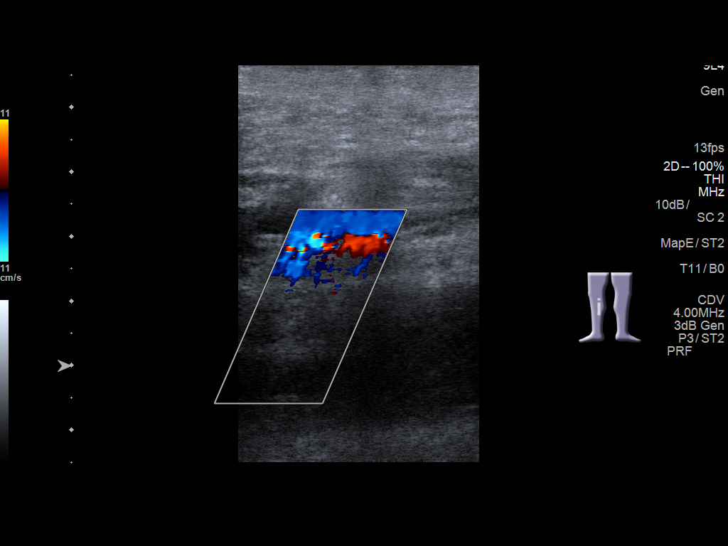

[13 of 24 positions shown; findings below may reference images not displayed]

FINDINGS: Contralateral Common Femoral Vein: Respiratory phasicity is normal
and symmetric with the symptomatic side. No evidence of thrombus.
Normal compressibility.

Common Femoral Vein: No evidence of thrombus. Normal
compressibility, respiratory phasicity and response to augmentation.

Saphenofemoral Junction: No evidence of thrombus. Normal
compressibility and flow on color Doppler imaging.

Profunda Femoral Vein: No evidence of thrombus. Normal
compressibility and flow on color Doppler imaging.

Femoral Vein: No evidence of thrombus. Normal compressibility,
respiratory phasicity and response to augmentation.

Popliteal Vein: No evidence of thrombus. Normal compressibility,
respiratory phasicity and response to augmentation.

Calf Veins: No evidence of thrombus. Normal compressibility and flow
on color Doppler imaging.

Superficial Great Saphenous Vein: No evidence of thrombus. Normal
compressibility.

Venous Reflux:  None.

Other Findings: No evidence of superficial thrombophlebitis or
abnormal fluid collection.
IMPRESSION: No evidence of right lower extremity deep venous thrombosis.
